# Patient Record
Sex: Male | Born: 1995 | Race: White | Hispanic: No | Marital: Single | Smoking: Never smoker
Health system: Southern US, Community
[De-identification: ages and names within clinical notes are randomized; demographics above are authoritative.]

---
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
## 2017-12-23 ENCOUNTER — Encounter (HOSPITAL_COMMUNITY): Payer: Self-pay | Admitting: Emergency Medicine

## 2017-12-23 ENCOUNTER — Other Ambulatory Visit: Payer: Self-pay

## 2017-12-23 ENCOUNTER — Emergency Department (HOSPITAL_COMMUNITY): Payer: BLUE CROSS/BLUE SHIELD

## 2017-12-23 ENCOUNTER — Emergency Department (HOSPITAL_COMMUNITY)
Admission: EM | Admit: 2017-12-23 | Discharge: 2017-12-23 | Disposition: A | Discharge status: Home / Self Care | Payer: BLUE CROSS/BLUE SHIELD | Attending: Emergency Medicine | Admitting: Emergency Medicine

## 2017-12-23 DIAGNOSIS — R0789 Other chest pain: Secondary | ICD-10-CM | POA: Diagnosis present

## 2017-12-23 DIAGNOSIS — R002 Palpitations: Secondary | ICD-10-CM | POA: Insufficient documentation

## 2017-12-23 LAB — CBC
HEMATOCRIT: 44.9 % (ref 39.0–52.0)
HEMOGLOBIN: 16 g/dL (ref 13.0–17.0)
MCH: 30.9 pg (ref 26.0–34.0)
MCHC: 35.6 g/dL (ref 30.0–36.0)
MCV: 86.7 fL (ref 78.0–100.0)
Platelets: 183 10*3/uL (ref 150–400)
RBC: 5.18 MIL/uL (ref 4.22–5.81)
RDW: 11.9 % (ref 11.5–15.5)
WBC: 6.4 10*3/uL (ref 4.0–10.5)

## 2017-12-23 LAB — D-DIMER, QUANTITATIVE (NOT AT ARMC)

## 2017-12-23 LAB — I-STAT TROPONIN, ED: Troponin i, poc: 0 ng/mL (ref 0.00–0.08)

## 2017-12-23 LAB — BASIC METABOLIC PANEL
ANION GAP: 12 (ref 5–15)
BUN: 8 mg/dL (ref 6–20)
CO2: 24 mmol/L (ref 22–32)
Calcium: 9.6 mg/dL (ref 8.9–10.3)
Chloride: 101 mmol/L (ref 101–111)
Creatinine, Ser: 1.1 mg/dL (ref 0.61–1.24)
GFR calc Af Amer: >60 mL/min (ref >60)
Glucose, Bld: 119 mg/dL — ABNORMAL HIGH (ref 65–99)
POTASSIUM: 3.3 mmol/L — AB (ref 3.5–5.1)
SODIUM: 137 mmol/L (ref 135–145)

## 2017-12-23 NOTE — ED Triage Notes (Signed)
Pt complaint of mid chest pain onset an hour ago; took 200 mg of caffeine at midnight and 0600 in attempt to stay up to study for tests.

## 2017-12-23 NOTE — ED Notes (Signed)
During orthostatics pt hr was in 100-107 at rest.

## 2017-12-23 NOTE — ED Provider Notes (Signed)
[facility]-EMERGENCY DEPT Provider Note   CSN: [id number] Arrival date & time: 12/23/17  0945     History   Chief Complaint Chief Complaint  Patient presents with  . Chest Pain    HPI Artelia LarocheChristopher M Deman is a 22 y.o. male presenting for evaluation of palpitations.  She states that at 920 this morning, he started to feel like his heart was beating very strongly and quickly.  He states that he has not had symptoms like this before.  He states that when he was just getting up to walk around, he felt like he was exerting himself a lot more than he needed to.  He denies chest pain, shortness of breath, nausea, vomiting, or diaphoresis.  He denies headaches, vision changes, abdominal pain, urinary symptoms, normal bowel movements.  He denies leg pain or swelling.  He denies recent travel, surgeries, immobilization, trauma, history of DVT/PE, hormone use, or history of cancer.  He denies other medical problems, states he does not take medications daily.  He states that he has not slept well since Monday, did not sleep at all last night.  Took a 200 mg caffeine pill at midnight and again at 6 AM.  He states he has taking caffeine pills before in the past without symptoms.  Currently he reports symptoms have improved when he is at rest, but when he stands up to walk around, he feels his heart is beating fast again.  He denies change in appetite or fluid intake.  HPI  History reviewed. No pertinent past medical history.  Patient Active Problem List   Diagnosis Date Noted  . Adjustment disorder with mixed anxiety and depressed mood 12/19/2015    History reviewed. No pertinent surgical history.      Home Medications    Prior to Admission medications   Medication Sig Start Date End Date Taking? Authorizing Provider  sertraline (ZOLOFT) 100 MG tablet Take 1 tablet (100 mg total) by mouth daily. 12/19/15   Christianne DolinMillican, Christy, NP  traZODone (DESYREL) 50 MG tablet Take 1  tablet (50 mg total) by mouth at bedtime. 12/19/15   Christianne DolinMillican, Christy, NP    Family History No family history on file.  Social History Social History   Tobacco Use  . Smoking status: Never Smoker  . Smokeless tobacco: Never Used  Substance Use Topics  . Alcohol use: Not on file  . Drug use: Not on file     Allergies   Patient has no known allergies.   Review of Systems Review of Systems  Cardiovascular: Positive for palpitations.       Felt tachycardic  All other systems reviewed and are negative.    Physical Exam Updated Vital Signs BP 129/77 (BP Location: Right Arm)   Pulse 70   Temp 98.8 F (37.1 C) (Oral)   Resp 16   Ht 5\' 10"  (1.778 m)   Wt 79.4 kg (175 lb)   SpO2 100%   BMI 25.11 kg/m   Physical Exam  Constitutional: He is oriented to person, place, and time. He appears well-developed and well-nourished. No distress.  Patient appears in no acute distress.  HENT:  Head: Normocephalic and atraumatic.  Eyes: Pupils are equal, round, and reactive to light. Conjunctivae and EOM are normal.  Neck: Normal range of motion. Neck supple.  Cardiovascular: Normal rate, regular rhythm and intact distal pulses.  Not tachycardic when resting in the chair.  Pulmonary/Chest: Effort normal and breath sounds normal. No respiratory distress. He has  no wheezes.  Speaking full sentences.  Clear lung sounds in all fields.  Abdominal: Soft. Bowel sounds are normal. He exhibits no distension. There is no tenderness.  Musculoskeletal: Normal range of motion. He exhibits no edema or tenderness.  No leg pain or swelling.  Neurological: He is alert and oriented to person, place, and time.  Skin: Skin is warm and dry.  Psychiatric: He has a normal mood and affect.  Nursing note and vitals reviewed.   ED Treatments / Results  Labs (all labs ordered are listed, but only abnormal results are displayed) Labs Reviewed  BASIC METABOLIC PANEL - Abnormal; Notable for the following  components:      Result Value   Potassium 3.3 (*)    Glucose, Bld 119 (*)    All other components within normal limits  CBC  D-DIMER, QUANTITATIVE (NOT AT [facility])  I-STAT TROPONIN, ED    EKG EKG Interpretation  Date/Time:  Wednesday December 23 2017 09:55:39 EDT Ventricular Rate:  96 PR Interval:    QRS Duration: 99 QT Interval:  355 QTC Calculation: 449 R Axis:   107 Text Interpretation:  Sinus rhythm Probable left atrial enlargement Borderline right axis deviation NO STEMI No old tracing to compare Confirmed by Drema Pry [phone]) on 12/23/2017 4:32:26 PM   Radiology Dg Chest 2 View  Result Date: 12/23/2017 CLINICAL DATA:  Mid chest pain beginning 1 hour ago. EXAM: CHEST - 2 VIEW COMPARISON:  None. FINDINGS: The heart size and mediastinal contours are within normal limits. Both lungs are clear. The visualized skeletal structures are unremarkable. IMPRESSION: No active cardiopulmonary disease. Electronically Signed   By: Elberta Fortis M.D.   On: 12/23/2017 11:10    Procedures Procedures (including critical care time)  Medications Ordered in ED Medications - No data to display   Initial Impression / Assessment and Plan / ED Course  I have reviewed the triage vital signs and the nursing notes.  Pertinent labs & imaging results that were available during my care of the patient were reviewed by me and considered in my medical decision making (see chart for details).     Patient presenting for evaluation of feeling like his heart was pounding out of his chest this morning.  Symptoms have mildly improved, but are still present with exertion.  Initial labs reassuring, troponin negative, EKG not concerning, chest x-ray without acute infection.  Will obtain d-dimer to rule out PE, although patient has no risk factors.  Discussed with attending, Dr. Eudelia Bunch agrees to plan.  D-dimer negative.  Discussed with patient that symptoms are likely related to to lack of sleep, caffeine pill  use, and anxiety (pt had chemistry test that he was studying all night for).  Discussed importance of sleep hygiene, to stop using caffeine pills, and to follow-up with primary care as needed.  At this time, doubt ACS, infection, PE, or other life-threatening emergent conditions.  At this time, patient appears to be discharged.  Return precautions given.  Patient states he understands and agrees to plan.  Final Clinical Impressions(s) / ED Diagnoses   Final diagnoses:  Palpitation    ED Discharge Orders    None       Alveria Apley, PA-C 12/23/17 2036    Nira Conn, MD 12/23/17 [phone]

## 2017-12-23 NOTE — Discharge Instructions (Addendum)
It is very important that you establish a regular sleep schedule. Avoid using caffeine pills, this will likely make your symptoms return. Follow-up with your primary care doctor if your symptoms are persisting. Return to the emergency room if you develop difficulty breathing, chest pain, or any new or concerning symptoms.

## 2017-12-23 NOTE — ED Notes (Signed)
Pt is alert and oriented x 4 and is verbally responsive. Pt reports that he hfelt has of his heart was working harder than it usually does and describes discomfort as pressure and that he an feel his heart beating. Pt reports to mother who is present in the room that he had a metallic taste and  Some dizziness.

## 2019-11-28 ENCOUNTER — Ambulatory Visit
Admission: RE | Admit: 2019-11-28 | Discharge: 2019-11-28 | Disposition: A | Discharge status: Home / Self Care | Payer: BLUE CROSS/BLUE SHIELD | Source: Ambulatory Visit | Attending: Family Medicine | Admitting: Family Medicine

## 2019-11-28 ENCOUNTER — Other Ambulatory Visit: Payer: Self-pay | Admitting: Family Medicine

## 2019-11-28 DIAGNOSIS — M546 Pain in thoracic spine: Secondary | ICD-10-CM

## 2020-07-19 ENCOUNTER — Encounter (HOSPITAL_BASED_OUTPATIENT_CLINIC_OR_DEPARTMENT_OTHER): Payer: Self-pay | Admitting: *Deleted

## 2020-07-19 ENCOUNTER — Other Ambulatory Visit: Payer: Self-pay

## 2020-07-19 ENCOUNTER — Emergency Department (HOSPITAL_BASED_OUTPATIENT_CLINIC_OR_DEPARTMENT_OTHER)
Admission: EM | Admit: 2020-07-19 | Discharge: 2020-07-19 | Disposition: A | Discharge status: Home / Self Care | Payer: BC Managed Care – PPO | Attending: Emergency Medicine | Admitting: Emergency Medicine

## 2020-07-19 DIAGNOSIS — F419 Anxiety disorder, unspecified: Secondary | ICD-10-CM | POA: Diagnosis present

## 2020-07-19 DIAGNOSIS — R002 Palpitations: Secondary | ICD-10-CM | POA: Diagnosis not present

## 2020-07-19 DIAGNOSIS — F410 Panic disorder [episodic paroxysmal anxiety] without agoraphobia: Secondary | ICD-10-CM | POA: Diagnosis not present

## 2020-07-19 LAB — CBC WITH DIFFERENTIAL/PLATELET
Abs Immature Granulocytes: 0.04 10*3/uL (ref 0.00–0.07)
Basophils Absolute: 0 10*3/uL (ref 0.0–0.1)
Basophils Relative: 0 %
Eosinophils Absolute: 0 10*3/uL (ref 0.0–0.5)
Eosinophils Relative: 0 %
HCT: 43.8 % (ref 39.0–52.0)
Hemoglobin: 16 g/dL (ref 13.0–17.0)
Immature Granulocytes: 0 %
Lymphocytes Relative: 22 %
Lymphs Abs: 2.1 10*3/uL (ref 0.7–4.0)
MCH: 30.4 pg (ref 26.0–34.0)
MCHC: 36.5 g/dL — ABNORMAL HIGH (ref 30.0–36.0)
MCV: 83.3 fL (ref 80.0–100.0)
Monocytes Absolute: 1 10*3/uL (ref 0.1–1.0)
Monocytes Relative: 11 %
Neutro Abs: 6.5 10*3/uL (ref 1.7–7.7)
Neutrophils Relative %: 67 %
Platelets: 216 10*3/uL (ref 150–400)
RBC: 5.26 MIL/uL (ref 4.22–5.81)
RDW: 11.7 % (ref 11.5–15.5)
WBC: 9.7 10*3/uL (ref 4.0–10.5)
nRBC: 0 % (ref 0.0–0.2)

## 2020-07-19 LAB — COMPREHENSIVE METABOLIC PANEL
ALT: 37 U/L (ref 0–44)
AST: 29 U/L (ref 15–41)
Albumin: 4.8 g/dL (ref 3.5–5.0)
Alkaline Phosphatase: 51 U/L (ref 38–126)
Anion gap: 12 (ref 5–15)
BUN: 16 mg/dL (ref 6–20)
CO2: 21 mmol/L — ABNORMAL LOW (ref 22–32)
Calcium: 9.8 mg/dL (ref 8.9–10.3)
Chloride: 105 mmol/L (ref 98–111)
Creatinine, Ser: 1.18 mg/dL (ref 0.61–1.24)
GFR, Estimated: >60 mL/min (ref >60)
Glucose, Bld: 108 mg/dL — ABNORMAL HIGH (ref 70–99)
Potassium: 3.1 mmol/L — ABNORMAL LOW (ref 3.5–5.1)
Sodium: 138 mmol/L (ref 135–145)
Total Bilirubin: 0.9 mg/dL (ref 0.3–1.2)
Total Protein: 7.9 g/dL (ref 6.5–8.1)

## 2020-07-19 MED ORDER — HYDROXYZINE PAMOATE 25 MG PO CAPS: 25.0000 mg | ORAL_CAPSULE | Freq: Three times a day (TID) | ORAL | 0 refills | Status: AC | PRN

## 2020-07-19 MED ORDER — HYDROXYZINE HCL 25 MG PO TABS
25.0000 mg | ORAL_TABLET | Freq: Once | ORAL | Status: AC
  Administered 2020-07-19: 25 mg via ORAL
  Filled 2020-07-19: qty 1

## 2020-07-19 MED ORDER — POTASSIUM CHLORIDE CRYS ER 20 MEQ PO TBCR
20.0000 meq | EXTENDED_RELEASE_TABLET | Freq: Once | ORAL | Status: AC
  Administered 2020-07-19: 20 meq via ORAL
  Filled 2020-07-19: qty 1

## 2020-07-19 NOTE — Discharge Instructions (Signed)
Avoid caffeine and supplements.  Start feeling a vitamin supplement with potassium

## 2020-07-19 NOTE — ED Triage Notes (Addendum)
Pt c/o being anxious , mind racing, "im paranoid ' x 1 week . Pt reports increased amount of caffeine and pre work out supplement.

## 2020-07-19 NOTE — ED Provider Notes (Signed)
[facility] EMERGENCY DEPARTMENT Provider Note   CSN: [id number] Arrival date & time: 07/19/20  2013     History Chief Complaint  Patient presents with  . Anxiety    Dale Sutton is a 24 y.o. male.  The history is provided by the patient. No language interpreter was used.  Anxiety This is a new problem. Episode onset: 5 days. The problem occurs constantly. Pertinent negatives include no chest pain. Nothing aggravates the symptoms. Nothing relieves the symptoms. He has tried nothing for the symptoms. The treatment provided no relief.  Pt reports his heart has been racing and he has been having palpations.  Pt reports he has been taking prework out supplements and high caffeine consumption.  Pt reports he feels like has been having panic attacks.  Pt worried that he has given himself Serotonin syndrome or done damage to his body      History reviewed. No pertinent past medical history.  Patient Active Problem List   Diagnosis Date Noted  . Adjustment disorder with mixed anxiety and depressed mood 12/19/2015    History reviewed. No pertinent surgical history.     No family history on file.  Social History   Tobacco Use  . Smoking status: Never Smoker  . Smokeless tobacco: Never Used  Substance Use Topics  . Alcohol use: Not Currently    Alcohol/week: 0.0 standard drinks    Comment: soc  . Drug use: Not Currently    Home Medications Prior to Admission medications   Medication Sig Start Date End Date Taking? Authorizing Provider  hydrOXYzine (VISTARIL) 25 MG capsule Take 1 capsule (25 mg total) by mouth 3 (three) times daily as needed for anxiety. 07/19/20   Elson Areas, PA-C  sertraline (ZOLOFT) 100 MG tablet Take 1 tablet (100 mg total) by mouth daily. 12/19/15   Georges Mouse, NP  traZODone (DESYREL) 50 MG tablet Take 1 tablet (50 mg total) by mouth at bedtime. 12/19/15   Georges Mouse, NP    Allergies    Patient has no known  allergies.  Review of Systems   Review of Systems  Cardiovascular: Negative for chest pain.  All other systems reviewed and are negative.   Physical Exam Updated Vital Signs BP 128/74   Pulse 84   Temp 98.5 F (36.9 C) (Oral)   Resp 11   Ht 5\' 9"  (1.753 m)   Wt 88.5 kg   SpO2 99%   BMI 28.80 kg/m   Physical Exam Vitals and nursing note reviewed.  Constitutional:      Appearance: He is well-developed.  HENT:     Head: Normocephalic and atraumatic.  Eyes:     Conjunctiva/sclera: Conjunctivae normal.  Cardiovascular:     Rate and Rhythm: Normal rate and regular rhythm.     Heart sounds: No murmur heard.   Pulmonary:     Effort: Pulmonary effort is normal. No respiratory distress.     Breath sounds: Normal breath sounds.  Abdominal:     Palpations: Abdomen is soft.     Tenderness: There is no abdominal tenderness.  Musculoskeletal:     Cervical back: Neck supple.  Skin:    General: Skin is warm and dry.  Neurological:     General: No focal deficit present.     Mental Status: He is alert.  Psychiatric:        Mood and Affect: Mood normal.     ED Results / Procedures / Treatments   Labs (  all labs ordered are listed, but only abnormal results are displayed) Labs Reviewed  CBC WITH DIFFERENTIAL/PLATELET - Abnormal; Notable for the following components:      Result Value   MCHC 36.5 (*)    All other components within normal limits  COMPREHENSIVE METABOLIC PANEL - Abnormal; Notable for the following components:   Potassium 3.1 (*)    CO2 21 (*)    Glucose, Bld 108 (*)    All other components within normal limits    EKG None  Radiology No results found.  Procedures Procedures (including critical care time)  Medications Ordered in ED Medications  potassium chloride SA (KLOR-CON) CR tablet 20 mEq (20 mEq Oral Given 07/19/20 2147)  hydrOXYzine (ATARAX/VISTARIL) tablet 25 mg (25 mg Oral Given 07/19/20 2147)    ED Course  I have reviewed the triage  vital signs and the nursing notes.  Pertinent labs & imaging results that were available during my care of the patient were reviewed by me and considered in my medical decision making (see chart for details).    MDM Rules/Calculators/A&P                          MDM: cbc and cmet shows slight decrease in potassium.  Pt given visteril and potassium.  Pt given rx for visteril.   Pt advised I doubt serotin syndrome.  Pt has normal vitals and normal labs.  Pt advised to see his primary Md for recheck  Final Clinical Impression(s) / ED Diagnoses Final diagnoses:  Anxiety  Panic attack  Palpitations    Rx / DC Orders ED Discharge Orders         Ordered    hydrOXYzine (VISTARIL) 25 MG capsule  3 times daily PRN        07/19/20 2142        An After Visit Summary was printed and given to the patient.    Elson Areas, Cordelia Poche 07/19/20 2308    Charlynne Pander, MD 07/19/20 2325
